# Patient Record
Sex: Male | Born: 1960 | Race: Black or African American | Hispanic: No | Marital: Married | Smoking: Former smoker
Health system: Southern US, Community
[De-identification: ages and names within clinical notes are randomized; demographics above are authoritative.]

## PROBLEM LIST (undated history)

## (undated) DIAGNOSIS — I10 Essential (primary) hypertension: Secondary | ICD-10-CM

## (undated) HISTORY — DX: Essential (primary) hypertension: I10

## (undated) HISTORY — PX: OTHER SURGICAL HISTORY: SHX169

---

## 2011-03-17 ENCOUNTER — Ambulatory Visit (INDEPENDENT_AMBULATORY_CARE_PROVIDER_SITE_OTHER): Payer: BC Managed Care – PPO | Admitting: Family Medicine

## 2011-03-17 VITALS — BP 123/86 | HR 74 | Temp 98.5°F | Resp 16 | Ht 69.25 in | Wt 241.2 lb

## 2011-03-17 DIAGNOSIS — J019 Acute sinusitis, unspecified: Secondary | ICD-10-CM

## 2011-03-17 MED ORDER — AZITHROMYCIN 250 MG PO TABS: ORAL_TABLET | ORAL | Status: AC

## 2011-03-17 MED ORDER — HYDROCODONE-HOMATROPINE 5-1.5 MG/5ML PO SYRP: 5.0000 mL | ORAL_SOLUTION | Freq: Four times a day (QID) | ORAL | Status: AC | PRN

## 2011-03-17 NOTE — Progress Notes (Signed)
  Subjective:    Patient ID: Tim Dennis, male    DOB: August 04, 1960, 51 y.o.   MRN: [id number]  HPI 51 yo male with URI symptoms for a week.  Sore throat, nasal congestion, PND, cough, trouble sleeping due to drainage and cough, sneezing, fatigue.  No fever.  Cough productive clear phlegm.  Trying mucinex and zyrtec - seemed to be helping and then worse last night.  Non-smoker.  No asthma.  Nosebleed today.     Review of Systems Negative except as per HPI     Objective:   Physical Exam  Constitutional: He appears well-developed. No distress.  HENT:  Right Ear: Tympanic membrane, external ear and ear canal normal. Tympanic membrane is not injected, not scarred, not perforated, not erythematous, not retracted and not bulging.  Left Ear: Tympanic membrane, external ear and ear canal normal. Tympanic membrane is not injected, not scarred, not perforated, not erythematous, not retracted and not bulging.  Nose: No mucosal edema or rhinorrhea. Right sinus exhibits no maxillary sinus tenderness and no frontal sinus tenderness. Left sinus exhibits no maxillary sinus tenderness and no frontal sinus tenderness.  Mouth/Throat: Uvula is midline, oropharynx is clear and moist and mucous membranes are normal. No oropharyngeal exudate or tonsillar abscesses.  Cardiovascular: Normal rate, regular rhythm, normal heart sounds and intact distal pulses.   No murmur heard. Pulmonary/Chest: Effort normal and breath sounds normal. No respiratory distress. He has no wheezes. He has no rales.  Lymphadenopathy:       Head (right side): No submandibular and no preauricular adenopathy present.       Head (left side): No submandibular and no preauricular adenopathy present.       Right cervical: No superficial cervical and no posterior cervical adenopathy present.      Left cervical: No superficial cervical and no posterior cervical adenopathy present.       Right: No supraclavicular adenopathy present.   Left: No supraclavicular adenopathy present.  Skin: Skin is warm and dry.          Assessment & Plan:  URI/early sinusitis - zpak, hycodan

## 2011-03-17 NOTE — Patient Instructions (Signed)
Thank you for coming in today.  I appreciate your patience as we become more comfortable with our computer system.  Today you saw Ardeen Garland, MD. I hope you feel better quickly. If you were not given printed prescriptions today, your medications have been sent to your specified pharmacy and can be picked up there.  Please review the information below regarding your diagnosis(es) at your leisure.     Sinusitis Sinuses are air pockets within the bones of your face. The growth of bacteria within a sinus leads to infection. The infection prevents the sinuses from draining. This infection is called sinusitis. SYMPTOMS  There will be different areas of pain depending on which sinuses have become infected.  The maxillary sinuses often produce pain beneath the eyes.   Frontal sinusitis may cause pain in the middle of the forehead and above the eyes.  Other problems (symptoms) include:  Toothaches.   Colored, pus-like (purulent) drainage from the nose.   Swelling, warmth, and tenderness over the sinus areas may be signs of infection.  TREATMENT  Sinusitis is most often determined by an exam.X-rays may be taken. If x-rays have been taken, make sure you obtain your results or find out how you are to obtain them. Your caregiver may give you medications (antibiotics). These are medications that will help kill the bacteria causing the infection. You may also be given a medication (decongestant) that helps to reduce sinus swelling.  HOME CARE INSTRUCTIONS   Only take over-the-counter or prescription medicines for pain, discomfort, or fever as directed by your caregiver.   Drink extra fluids. Fluids help thin the mucus so your sinuses can drain more easily.   Applying either moist heat or ice packs to the sinus areas may help relieve discomfort.   Use saline nasal sprays to help moisten your sinuses. The sprays can be found at your local drugstore.  SEEK IMMEDIATE MEDICAL CARE IF:  You have a  fever.   You have increasing pain, severe headaches, or toothache.   You have nausea, vomiting, or drowsiness.   You develop unusual swelling around the face or trouble seeing.  MAKE SURE YOU:   Understand these instructions.   Will watch your condition.   Will get help right away if you are not doing well or get worse.  Document Released: 01/13/2005 Document Revised: 09/25/2010 Document Reviewed: 08/12/2006 [facility] Patient Information 2012 [place], [place].

## 2011-03-27 ENCOUNTER — Other Ambulatory Visit: Payer: Self-pay | Admitting: Physician Assistant

## 2011-03-27 MED ORDER — LISINOPRIL 10 MG PO TABS: 10.0000 mg | ORAL_TABLET | Freq: Every day | ORAL | Status: DC

## 2011-06-02 ENCOUNTER — Ambulatory Visit (INDEPENDENT_AMBULATORY_CARE_PROVIDER_SITE_OTHER): Payer: BC Managed Care – PPO | Admitting: Internal Medicine

## 2011-06-02 ENCOUNTER — Encounter: Payer: Self-pay | Admitting: Internal Medicine

## 2011-06-02 DIAGNOSIS — M109 Gout, unspecified: Secondary | ICD-10-CM

## 2011-06-02 DIAGNOSIS — Z79899 Other long term (current) drug therapy: Secondary | ICD-10-CM

## 2011-06-02 DIAGNOSIS — I10 Essential (primary) hypertension: Secondary | ICD-10-CM | POA: Insufficient documentation

## 2011-06-02 LAB — BASIC METABOLIC PANEL
BUN: 15 mg/dL (ref 6–23)
CO2: 25 mEq/L (ref 19–32)
Calcium: 9.3 mg/dL (ref 8.4–10.5)
Chloride: 104 mEq/L (ref 96–112)
Creat: 1.29 mg/dL (ref 0.50–1.35)
Glucose, Bld: 85 mg/dL (ref 70–99)
Potassium: 4 mEq/L (ref 3.5–5.3)
Sodium: 138 mEq/L (ref 135–145)

## 2011-06-02 MED ORDER — LISINOPRIL 10 MG PO TABS: 10.0000 mg | ORAL_TABLET | Freq: Every day | ORAL | Status: DC

## 2011-06-02 NOTE — Progress Notes (Signed)
  Subjective:    Patient ID: Tim Dennis, male    DOB: 1960-05-04, 51 y.o.   MRN: [id number]  HPI Doing well Needs to lose 15lbs  Review of Systems     Objective:   Physical Exam Normal all systems     Assessment & Plan:  Calorie counting info HTN controlled

## 2011-06-02 NOTE — Patient Instructions (Signed)
Calorie Counting Diet  A calorie counting diet requires you to eat the number of calories that are right for you in a day. Calories are the measurement of how much energy you get from the food you eat. Eating the right amount of calories is important for staying at a healthy weight. If you eat too many calories, your body will store them as fat and you may gain weight. If you eat too few calories, you may lose weight. Counting the number of calories you eat during a day will help you know if you are eating the right amount. A Registered Dietitian can determine how many calories you need in a day. The amount of calories needed varies from person to person.  If your goal is to lose weight, you will need to eat fewer calories. Losing weight can benefit you if you are overweight or have health problems such as heart disease, high blood pressure, or diabetes. If your goal is to gain weight, you will need to eat more calories. Gaining weight may be necessary if you have a certain health problem that causes your body to need more energy.  TIPS  Whether you are increasing or decreasing the number of calories you eat during a day, it may be hard to get used to changes in what you eat and drink. The following are tips to help you keep track of the number of calories you eat.   Measure foods at home with measuring cups. This helps you know the amount of food and number of calories you are eating.    Restaurants often serve food in amounts that are larger than 1 serving. While eating out, estimate how many servings of a food you are given. For example, a serving of cooked rice is  cup or about the size of half of a fist. Knowing serving sizes will help you be aware of how much food you are eating at restaurants.    Ask for smaller portion sizes or child-size portions at restaurants.    Plan to eat half of a meal at a restaurant. Take the rest home or share the other half with a friend.    Read the Nutrition Facts panel on  food labels for calorie content and serving size. You can find out how many servings are in a package, the size of a serving, and the number of calories each serving has.    For example, a package might contain 3 cookies. The Nutrition Facts panel on that package says that 1 serving is 1 cookie. Below that, it will say there are 3 servings in the container. The calories section of the Nutrition Facts label says there are 90 calories. This means there are 90 calories in 1 cookie (1 serving). If you eat 1 cookie you have eaten 90 calories. If you eat all 3 cookies, you have eaten 270 calories (3 servings x 90 calories = 270 calories).   The list below tells you how big or small some common portion sizes are.   1 oz.........4 stacked dice.    3 oz.........Deck of cards.    1 tsp........Tip of little finger.    1 tbs........Thumb.    2 tbs........Golf ball.     cup.......Half of a fist.    1 cup........A fist.   KEEP A FOOD LOG  Write down every food item you eat, the amount you eat, and the number of calories in each food you eat during the day. At the end of   the day, you can add up the total number of calories you have eaten. It may help to keep a list like the one below. Find out the calorie information by reading the Nutrition Facts panel on food labels.  Breakfast   Bran cereal (1 cup, 110 calories).    Fat-free milk ( cup, 45 calories).   Snack   Apple (1 medium, 80 calories).   Lunch   Spinach (1 cup, 20 calories).    Tomato ( medium, 20 calories).    Chicken breast strips (3 oz, 165 calories).    Shredded cheddar cheese ( cup, 110 calories).    Light Italian dressing (2 tbs, 60 calories).    Whole-wheat bread (1 slice, 80 calories).    Tub margarine (1 tsp, 35 calories).    Vegetable soup (1 cup, 160 calories).   Dinner   Pork chop (3 oz, 190 calories).    Brown rice (1 cup, 215 calories).    Steamed broccoli ( cup, 20 calories).    Strawberries (1  cup, 65 calories).    Whipped  cream (1 tbs, 50 calories).   Daily Calorie Total: 1425  Document Released: 01/13/2005 Document Revised: 01/02/2011 Document Reviewed: 07/10/2006  [employer] Patient Information 2012 [employer].

## 2011-08-18 ENCOUNTER — Ambulatory Visit (INDEPENDENT_AMBULATORY_CARE_PROVIDER_SITE_OTHER): Payer: BC Managed Care – PPO | Admitting: Physician Assistant

## 2011-08-18 VITALS — BP 130/100 | HR 66 | Temp 97.7°F | Resp 16 | Ht 69.25 in | Wt 235.6 lb

## 2011-08-18 DIAGNOSIS — J40 Bronchitis, not specified as acute or chronic: Secondary | ICD-10-CM

## 2011-08-18 DIAGNOSIS — R059 Cough, unspecified: Secondary | ICD-10-CM

## 2011-08-18 DIAGNOSIS — H1089 Other conjunctivitis: Secondary | ICD-10-CM

## 2011-08-18 DIAGNOSIS — R05 Cough: Secondary | ICD-10-CM

## 2011-08-18 DIAGNOSIS — H109 Unspecified conjunctivitis: Secondary | ICD-10-CM

## 2011-08-18 MED ORDER — AZITHROMYCIN 250 MG PO TABS: ORAL_TABLET | ORAL | Status: AC

## 2011-08-18 MED ORDER — POLYMYXIN B-TRIMETHOPRIM 10000-0.1 UNIT/ML-% OP SOLN: 1.0000 [drp] | OPHTHALMIC | Status: AC

## 2011-08-18 NOTE — Patient Instructions (Addendum)
Continue plain Mucinex daily Take Zyrtec as needed for postnasal drainage Recommend Hycodan cough syrup as needed for cough

## 2011-08-18 NOTE — Progress Notes (Signed)
  Subjective:    Patient ID: Tim Dennis, male    DOB: October 06, 1960, 51 y.o.   MRN: [id number]  HPI 51 year old male presents with 1 week history of nasal congestion, postnasal drainage, and cough.  Had sore throat at onset but that has improved.  Denies fever, chills, otalgia, nausea, or vomiting.  He has been taking Mucinex-D and plain Zyrtec. He had some Hycodan from his last visit that he took which has helped with his cough.  He does not smoke. No history of asthma or seasonal allergies.    In addition, he woke up this morning with purulent drainage from his eyes.  Both are red and draining. Admits to pruritis and foreign body sensation. No vision changes or pain.     Review of Systems  All other systems reviewed and are negative.       Objective:   Physical Exam  Constitutional: He is oriented to person, place, and time. He appears well-developed and well-nourished.  HENT:  Head: Normocephalic and atraumatic.  Right Ear: External ear normal.  Left Ear: External ear normal.  Eyes: EOM are normal. Pupils are equal, round, and reactive to light. Right eye exhibits discharge. Left eye exhibits discharge. Right conjunctiva is injected. Left conjunctiva is injected.  Neck: Normal range of motion.  Cardiovascular: Normal rate, regular rhythm and normal heart sounds.   Pulmonary/Chest: Effort normal and breath sounds normal.  Lymphadenopathy:    He has no cervical adenopathy.  Neurological: He is alert and oriented to person, place, and time.  Psychiatric: He has a normal mood and affect. His behavior is normal. Judgment and thought content normal.          Assessment & Plan:   1. Bacterial conjunctivitis  Apply 1 drop to both eyes q4hours.   Good handdwashing Recommend warm compress to help with drainage trimethoprim-polymyxin b (POLYTRIM) ophthalmic solution  2. Bronchitis  Will treat with Z-pack.  D/c Mucinex-D, this is likely why blood pressure is elevated today.  Take plain Mucinex  azithromycin (ZITHROMAX) 250 MG tablet  3. Cough  Hycodan as needed for cough

## 2011-10-09 ENCOUNTER — Ambulatory Visit (INDEPENDENT_AMBULATORY_CARE_PROVIDER_SITE_OTHER): Payer: BC Managed Care – PPO | Admitting: Internal Medicine

## 2011-10-09 VITALS — BP 131/84 | HR 76 | Temp 97.7°F | Resp 16 | Ht 69.5 in | Wt 236.0 lb

## 2011-10-09 DIAGNOSIS — J019 Acute sinusitis, unspecified: Secondary | ICD-10-CM

## 2011-10-09 DIAGNOSIS — R05 Cough: Secondary | ICD-10-CM

## 2011-10-09 DIAGNOSIS — J329 Chronic sinusitis, unspecified: Secondary | ICD-10-CM

## 2011-10-09 DIAGNOSIS — R040 Epistaxis: Secondary | ICD-10-CM

## 2011-10-09 LAB — POCT CBC
MCH, POC: 28.1 pg (ref 27–31.2)
MID (cbc): 0.4 (ref 0–0.9)
MPV: 12.3 fL (ref 0–99.8)
POC Granulocyte: 1.3 — AB (ref 2–6.9)
POC MID %: 9.7 %M (ref 0–12)
Platelet Count, POC: 222 10*3/uL (ref 142–424)
RBC: 5.05 M/uL (ref 4.69–6.13)
WBC: 3.8 10*3/uL — AB (ref 4.6–10.2)

## 2011-10-09 MED ORDER — HYDROCODONE-ACETAMINOPHEN 7.5-500 MG/15ML PO SOLN: 5.0000 mL | Freq: Four times a day (QID) | ORAL | Status: AC | PRN

## 2011-10-09 MED ORDER — AZITHROMYCIN 500 MG PO TABS: 500.0000 mg | ORAL_TABLET | Freq: Every day | ORAL | Status: AC

## 2011-10-09 NOTE — Progress Notes (Signed)
  Subjective:    Patient ID: NICKOLI TRUPP, male    DOB: 10-18-1960, 51 y.o.   MRN: [id number]  HPI Cough, green nasal discharge, bleeding with sneezing. No cp,sob.   Review of Systems     Objective:   Physical Exam  Constitutional: He is oriented to person, place, and time. He appears well-developed and well-nourished.  HENT:  Right Ear: External ear normal.  Left Ear: External ear normal.  Nose: Mucosal edema, rhinorrhea and sinus tenderness present. Epistaxis is observed. Right sinus exhibits maxillary sinus tenderness. Left sinus exhibits maxillary sinus tenderness.  Mouth/Throat: Oropharyngeal exudate present.  Cardiovascular: Normal rate and normal heart sounds.   Pulmonary/Chest: Effort normal and breath sounds normal.  Neurological: He is alert and oriented to person, place, and time. Coordination normal.  Skin: Skin is warm and dry.  Psychiatric: He has a normal mood and affect.          Assessment & Plan:  Sinusitis Cough Epistaxis Hold asa

## 2011-10-09 NOTE — Patient Instructions (Signed)
Sinusitis Sinuses are air pockets within the bones of your face. The growth of bacteria within a sinus leads to infection. The infection prevents the sinuses from draining. This infection is called sinusitis. SYMPTOMS  There will be different areas of pain depending on which sinuses have become infected.  The maxillary sinuses often produce pain beneath the eyes.   Frontal sinusitis may cause pain in the middle of the forehead and above the eyes.  Other problems (symptoms) include:  Toothaches.   Colored, pus-like (purulent) drainage from the nose.   Swelling, warmth, and tenderness over the sinus areas may be signs of infection.  TREATMENT  Sinusitis is most often determined by an exam.X-rays may be taken. If x-rays have been taken, make sure you obtain your results or find out how you are to obtain them. Your caregiver may give you medications (antibiotics). These are medications that will help kill the bacteria causing the infection. You may also be given a medication (decongestant) that helps to reduce sinus swelling.  HOME CARE INSTRUCTIONS   Only take over-the-counter or prescription medicines for pain, discomfort, or fever as directed by your caregiver.   Drink extra fluids. Fluids help thin the mucus so your sinuses can drain more easily.   Applying either moist heat or ice packs to the sinus areas may help relieve discomfort.   Use saline nasal sprays to help moisten your sinuses. The sprays can be found at your local drugstore.  SEEK IMMEDIATE MEDICAL CARE IF:  You have a fever.   You have increasing pain, severe headaches, or toothache.   You have nausea, vomiting, or drowsiness.   You develop unusual swelling around the face or trouble seeing.  MAKE SURE YOU:   Understand these instructions.   Will watch your condition.   Will get help right away if you are not doing well or get worse.  Document Released: 01/13/2005 Document Revised: 01/02/2011 Document Reviewed:  08/12/2006 [facility] Patient Information 2012 [place], [place].Nosebleed Nosebleeds can be caused by many conditions including trauma, infections, polyps, foreign bodies, dry mucous membranes or climate, medications and air conditioning. Most nosebleeds occur in the front of the nose. It is because of this location that most nosebleeds can be controlled by pinching the nostrils gently and continuously. Do this for at least 10 to 20 minutes. The reason for this long continuous pressure is that you must hold it long enough for the blood to clot. If during that 10 to 20 minute time period, pressure is released, the process may have to be started again. The nosebleed may stop by itself, quit with pressure, need concentrated heating (cautery) or stop with pressure from packing. HOME CARE INSTRUCTIONS   If your nose was packed, try to maintain the pack inside until your caregiver removes it. If a gauze pack was used and it starts to fall out, gently replace or cut the end off. Do not cut if a balloon catheter was used to pack the nose. Otherwise, do not remove unless instructed.   Avoid blowing your nose for 12 hours after treatment. This could dislodge the pack or clot and start bleeding again.   If the bleeding starts again, sit up and bending forward, gently pinch the front half of your nose continuously for 20 minutes.   If bleeding was caused by dry mucous membranes, cover the inside of your nose every morning with a petroleum or antibiotic ointment. Use your little fingertip as an applicator. Do this as needed during dry weather.  This will keep the mucous membranes moist and allow them to heal.   Maintain humidity in your home by using less air conditioning or using a humidifier.   Do not use aspirin or medications which make bleeding more likely. Your caregiver can give you recommendations on this.   Resume normal activities as able but try to avoid straining, lifting or bending at the waist for  several days.   If the nosebleeds become recurrent and the cause is unknown, your caregiver may suggest laboratory tests.  SEEK IMMEDIATE MEDICAL CARE IF:   Bleeding recurs and cannot be controlled.   There is unusual bleeding from or bruising on other parts of the body.   You have a fever.   Nosebleeds continue.   There is any worsening of the condition which originally brought you in.   You become lightheaded, feel faint, become sweaty or vomit blood.  MAKE SURE YOU:   Understand these instructions.   Will watch your condition.   Will get help right away if you are not doing well or get worse.  Document Released: 10/23/2004 Document Revised: 01/02/2011 Document Reviewed: 12/15/2008 [facility] Patient Information 2012 [place], [place].

## 2011-12-24 ENCOUNTER — Other Ambulatory Visit: Payer: Self-pay | Admitting: Internal Medicine

## 2012-01-05 ENCOUNTER — Ambulatory Visit (INDEPENDENT_AMBULATORY_CARE_PROVIDER_SITE_OTHER): Payer: BC Managed Care – PPO | Admitting: Internal Medicine

## 2012-01-05 ENCOUNTER — Encounter: Payer: Self-pay | Admitting: Internal Medicine

## 2012-01-05 VITALS — BP 130/98 | HR 66 | Temp 97.8°F | Resp 16 | Ht 69.75 in | Wt 238.8 lb

## 2012-01-05 DIAGNOSIS — Z0000 Encounter for general adult medical examination without abnormal findings: Secondary | ICD-10-CM

## 2012-01-05 DIAGNOSIS — I10 Essential (primary) hypertension: Secondary | ICD-10-CM

## 2012-01-05 DIAGNOSIS — Z23 Encounter for immunization: Secondary | ICD-10-CM

## 2012-01-05 LAB — IFOBT (OCCULT BLOOD): IFOBT: NEGATIVE

## 2012-01-05 LAB — COMPREHENSIVE METABOLIC PANEL
ALT: 25 U/L (ref 0–53)
AST: 26 U/L (ref 0–37)
Albumin: 4.2 g/dL (ref 3.5–5.2)
BUN: 17 mg/dL (ref 6–23)
CO2: 26 mEq/L (ref 19–32)
Calcium: 9 mg/dL (ref 8.4–10.5)
Chloride: 105 mEq/L (ref 96–112)
Potassium: 4.1 mEq/L (ref 3.5–5.3)

## 2012-01-05 LAB — CBC WITH DIFFERENTIAL/PLATELET
Basophils Absolute: 0 10*3/uL (ref 0.0–0.1)
Basophils Relative: 0 % (ref 0–1)
Hemoglobin: 15 g/dL (ref 13.0–17.0)
Lymphocytes Relative: 36 % (ref 12–46)
MCHC: 35 g/dL (ref 30.0–36.0)
Monocytes Relative: 6 % (ref 3–12)
Neutro Abs: 3.1 10*3/uL (ref 1.7–7.7)
Neutrophils Relative %: 56 % (ref 43–77)
RDW: 14.5 % (ref 11.5–15.5)
WBC: 5.7 10*3/uL (ref 4.0–10.5)

## 2012-01-05 LAB — POCT URINALYSIS DIPSTICK
Glucose, UA: NEGATIVE
Ketones, UA: NEGATIVE
Spec Grav, UA: 1.02

## 2012-01-05 LAB — POCT UA - MICROSCOPIC ONLY
Bacteria, U Microscopic: NEGATIVE
Mucus, UA: NEGATIVE

## 2012-01-05 LAB — LIPID PANEL
Cholesterol: 136 mg/dL (ref 0–200)
LDL Cholesterol: 72 mg/dL (ref 0–99)
VLDL: 12 mg/dL (ref 0–40)

## 2012-01-05 LAB — PSA: PSA: 1.32 ng/mL (ref <=4.00)

## 2012-01-05 NOTE — Patient Instructions (Signed)

## 2012-01-05 NOTE — Progress Notes (Signed)
  Subjective:    Patient ID: Tim Dennis, male    DOB: [birth year], 51 y.o.   MRN: [id number]  HPI HTN controlled Overweight Tolerates meds Exercising   Review of Systems  Constitutional: Negative.   HENT: Negative.   Eyes: Negative.   Respiratory: Negative.   Cardiovascular: Negative.   Gastrointestinal: Negative.   Genitourinary: Negative.   Musculoskeletal: Negative.   Neurological: Negative.   Hematological: Negative.   Psychiatric/Behavioral: Negative.        Objective:   Physical Exam  Constitutional: He is oriented to person, place, and time. He appears well-developed and well-nourished.  HENT:  Right Ear: External ear normal.  Left Ear: External ear normal.  Nose: Nose normal.  Mouth/Throat: Oropharynx is clear and moist.  Eyes: EOM are normal. Pupils are equal, round, and reactive to light.  Neck: Neck supple. No tracheal deviation present. No thyromegaly present.  Cardiovascular: Normal rate, regular rhythm, normal heart sounds and intact distal pulses.   Pulmonary/Chest: Effort normal and breath sounds normal.  Abdominal: He exhibits no mass. There is no tenderness.  Genitourinary: Rectum normal, prostate normal and penis normal.  Musculoskeletal: Normal range of motion.  Lymphadenopathy:    He has no cervical adenopathy.  Neurological: He is alert and oriented to person, place, and time. He has normal reflexes. No cranial nerve deficit. He exhibits normal muscle tone. Coordination normal.  Skin: Skin is warm and dry.  Psychiatric: He has a normal mood and affect. Thought content normal.   Results for orders placed in visit on 01/05/12  POCT UA - MICROSCOPIC ONLY      Component Value Range   WBC, Ur, HPF, POC neg     RBC, urine, microscopic 0-3     Bacteria, U Microscopic neg     Mucus, UA neg     Epithelial cells, urine per micros 0-1     Crystals, Ur, HPF, POC neg     Casts, Ur, LPF, POC neg     Yeast, UA neg    POCT URINALYSIS DIPSTICK    Component Value Range   Color, UA yellow     Clarity, UA clear     Glucose, UA neg     Bilirubin, UA neg     Ketones, UA neg     Spec Grav, UA 1.020     Blood, UA neg     pH, UA 5.0     Protein, UA neg     Urobilinogen, UA 0.2     Nitrite, UA neg     Leukocytes, UA Negative    IFOBT (OCCULT BLOOD)      Component Value Range   IFOBT Negative            Assessment & Plan:  Continue home bp monitoring RF meds 1 yr

## 2012-01-06 ENCOUNTER — Encounter: Payer: Self-pay | Admitting: *Deleted

## 2012-01-12 ENCOUNTER — Telehealth: Payer: Self-pay

## 2012-01-12 NOTE — Telephone Encounter (Signed)
Patient states that his bp was up during physical last week and yesterday it was 130/89. Would like to know if Dr. Perrin Maltese wants him to take Lisinopril. Please call at [phone]

## 2012-01-12 NOTE — Telephone Encounter (Signed)
Spoke with patient states he takes Lisinopril 10 mg daily for six days and Lisinopril 10 mg/ HCTZ once a week.  Should he increase he's medications?

## 2012-02-17 ENCOUNTER — Other Ambulatory Visit: Payer: Self-pay | Admitting: Internal Medicine

## 2012-02-17 ENCOUNTER — Ambulatory Visit (INDEPENDENT_AMBULATORY_CARE_PROVIDER_SITE_OTHER): Payer: BC Managed Care – PPO | Admitting: Family Medicine

## 2012-02-17 ENCOUNTER — Ambulatory Visit: Payer: BC Managed Care – PPO

## 2012-02-17 VITALS — BP 128/92 | HR 68 | Temp 97.7°F | Resp 20 | Ht 69.25 in | Wt 242.0 lb

## 2012-02-17 DIAGNOSIS — M25519 Pain in unspecified shoulder: Secondary | ICD-10-CM

## 2012-02-17 DIAGNOSIS — R0789 Other chest pain: Secondary | ICD-10-CM

## 2012-02-17 DIAGNOSIS — R071 Chest pain on breathing: Secondary | ICD-10-CM

## 2012-02-17 MED ORDER — NABUMETONE 750 MG PO TABS: 750.0000 mg | ORAL_TABLET | Freq: Two times a day (BID) | ORAL | Status: DC

## 2012-02-17 NOTE — Progress Notes (Signed)
Subjective: 52 year old man who presents with complaint of right shoulder pain for the last 8 days or so. He did have a dear hangup when he was pulling out of the [place] initially, and that may have started solve the problem. He had bursitis many years ago. He has over the last few days had a progression of the pain to where its hurting across anterior chest along the collarbone area and into the left shoulder some. Today he spent the day at the desk, did not do much physical work. Sometimes has to move a desk or something like that, but does not do regular physical labor.  Objective: Pleasant man in no major distress at this time. However when the pain flares bad he says he would give it an 8/10 at times. He has some tenderness in the right shoulder under the a.c. joint and posterior to that. He has fair range of motion of the shoulder. Right now it seems to have calmed down a little but is not exquisitely tender. His left shoulder seems nontender. No tenderness across chest wall. Chest is clear to auscultation. He takes a deep breath however he says he feels across his upper chest. Heart regular without murmurs.  Assessment: Bursitis and shoulder strain and pain right shoulder. Nonspecific chest wall pain  Plan: We'll get an x-ray of his right shoulder and proceed from there  [facility] reading (PRIMARY) by  Dr. Alwyn Ren Normal shoulder .  Treat patient report Relafen 750 twice a day. If not doing better he had a cortisone shot. He elected not to have the shot tonight.

## 2012-02-17 NOTE — Patient Instructions (Addendum)
Take the medication one twice daily with food. If you continue to have a lot of pain in the shoulder come back and we can give an injection of cortisone.

## 2012-04-27 ENCOUNTER — Other Ambulatory Visit: Payer: Self-pay | Admitting: Physician Assistant

## 2012-06-09 ENCOUNTER — Other Ambulatory Visit: Payer: Self-pay | Admitting: Internal Medicine

## 2012-07-12 ENCOUNTER — Ambulatory Visit: Payer: BC Managed Care – PPO | Admitting: Internal Medicine

## 2012-07-19 ENCOUNTER — Ambulatory Visit (INDEPENDENT_AMBULATORY_CARE_PROVIDER_SITE_OTHER): Payer: BC Managed Care – PPO | Admitting: Internal Medicine

## 2012-07-19 ENCOUNTER — Encounter: Payer: Self-pay | Admitting: Internal Medicine

## 2012-07-19 VITALS — BP 128/93 | HR 74 | Temp 97.4°F | Resp 16 | Ht 69.0 in | Wt 243.0 lb

## 2012-07-19 DIAGNOSIS — Z794 Long term (current) use of insulin: Secondary | ICD-10-CM

## 2012-07-19 DIAGNOSIS — I10 Essential (primary) hypertension: Secondary | ICD-10-CM

## 2012-07-19 DIAGNOSIS — Z79899 Other long term (current) drug therapy: Secondary | ICD-10-CM

## 2012-07-19 DIAGNOSIS — M109 Gout, unspecified: Secondary | ICD-10-CM

## 2012-07-19 LAB — COMPREHENSIVE METABOLIC PANEL
AST: 22 U/L (ref 0–37)
Albumin: 3.8 g/dL (ref 3.5–5.2)
Alkaline Phosphatase: 75 U/L (ref 39–117)
Calcium: 9.3 mg/dL (ref 8.4–10.5)
Chloride: 106 mEq/L (ref 96–112)
Glucose, Bld: 97 mg/dL (ref 70–99)
Potassium: 4 mEq/L (ref 3.5–5.3)
Sodium: 141 mEq/L (ref 135–145)
Total Protein: 7 g/dL (ref 6.0–8.3)

## 2012-07-19 LAB — CBC WITH DIFFERENTIAL/PLATELET
Basophils Absolute: 0 10*3/uL (ref 0.0–0.1)
Eosinophils Absolute: 0.2 10*3/uL (ref 0.0–0.7)
Eosinophils Relative: 3 % (ref 0–5)
MCH: 29.6 pg (ref 26.0–34.0)
MCV: 85.7 fL (ref 78.0–100.0)
Neutrophils Relative %: 54 % (ref 43–77)
Platelets: 196 10*3/uL (ref 150–400)
RDW: 14.4 % (ref 11.5–15.5)
WBC: 5.8 10*3/uL (ref 4.0–10.5)

## 2012-07-19 NOTE — Patient Instructions (Addendum)
2 Gram Low Sodium Diet A 2 gram sodium diet restricts the amount of sodium in the diet to no more than 2 g or 2000 mg daily. Limiting the amount of sodium is often used to help lower blood pressure. It is important if you have heart, liver, or kidney problems. Many foods contain sodium for flavor and sometimes as a preservative. When the amount of sodium in a diet needs to be low, it is important to know what to look for when choosing foods and drinks. The following includes some information and guidelines to help make it easier for you to adapt to a low sodium diet. QUICK TIPS  Do not add salt to food.  Avoid convenience items and fast food.  Choose unsalted snack foods.  Buy lower sodium products, often labeled as "lower sodium" or "no salt added."  Check food labels to learn how much sodium is in 1 serving.  When eating at a restaurant, ask that your food be prepared with less salt or none, if possible. READING FOOD LABELS FOR SODIUM INFORMATION The nutrition facts label is a good place to find how much sodium is in foods. Look for products with no more than 500 to 600 mg of sodium per meal and no more than 150 mg per serving. Remember that 2 g = 2000 mg. The food label may also list foods as:  Sodium-free: Less than 5 mg in a serving.  Very low sodium: 35 mg or less in a serving.  Low-sodium: 140 mg or less in a serving.  Light in sodium: 50% less sodium in a serving. For example, if a food that usually has 300 mg of sodium is changed to become light in sodium, it will have 150 mg of sodium.  Reduced sodium: 25% less sodium in a serving. For example, if a food that usually has 400 mg of sodium is changed to reduced sodium, it will have 300 mg of sodium. CHOOSING FOODS Grains  Avoid: Salted crackers and snack items. Some cereals, including instant hot cereals. Bread stuffing and biscuit mixes. Seasoned rice or pasta mixes.  Choose: Unsalted snack items. Low-sodium cereals, oats,  puffed wheat and rice, shredded wheat. English muffins and bread. Pasta. Meats  Avoid: Salted, canned, smoked, spiced, pickled meats, including fish and poultry. Bacon, ham, sausage, cold cuts, hot dogs, anchovies.  Choose: Low-sodium canned tuna and salmon. Fresh or frozen meat, poultry, and fish. Dairy  Avoid: Processed cheese and spreads. Cottage cheese. Buttermilk and condensed milk. Regular cheese.  Choose: Milk. Low-sodium cottage cheese. Yogurt. Sour cream. Low-sodium cheese. Fruits and Vegetables  Avoid: Regular canned vegetables. Regular canned tomato sauce and paste. Frozen vegetables in sauces. Olives. Pickles. Relishes. Sauerkraut.  Choose: Low-sodium canned vegetables. Low-sodium tomato sauce and paste. Frozen or fresh vegetables. Fresh and frozen fruit. Condiments  Avoid: Canned and packaged gravies. Worcestershire sauce. Tartar sauce. Barbecue sauce. Soy sauce. Steak sauce. Ketchup. Onion, garlic, and table salt. Meat flavorings and tenderizers.  Choose: Fresh and dried herbs and spices. Low-sodium varieties of mustard and ketchup. Lemon juice. Tabasco sauce. Horseradish. SAMPLE 2 GRAM SODIUM MEAL PLAN Breakfast / Sodium (mg)  1 cup low-fat milk / 143 mg  2 slices whole-wheat toast / 270 mg  1 tbs heart-healthy margarine / 153 mg  1 hard-boiled egg / 139 mg  1 small orange / 0 mg Lunch / Sodium (mg)  1 cup raw carrots / 76 mg   cup hummus / 298 mg  1 cup low-fat milk /   143 mg   cup red grapes / 2 mg  1 whole-wheat pita bread / 356 mg Dinner / Sodium (mg)  1 cup whole-wheat pasta / 2 mg  1 cup low-sodium tomato sauce / 73 mg  3 oz lean ground beef / 57 mg  1 small side salad (1 cup raw spinach leaves,  cup cucumber,  cup yellow bell pepper) with 1 tsp olive oil and 1 tsp red wine vinegar / 25 mg Snack / Sodium (mg)  1 container low-fat vanilla yogurt / 107 mg  3 graham cracker squares / 127 mg Nutrient Analysis  Calories: 2033  Protein:  77 g  Carbohydrate: 282 g  Fat: 72 g  Sodium: 1971 mg Document Released: 01/13/2005 Document Revised: 04/07/2011 Document Reviewed: 04/16/2009 [employer] Patient Information 2014 [employer].  

## 2012-07-19 NOTE — Progress Notes (Signed)
Subjective:    Patient ID: Tim Dennis, male    DOB: 03-19-1960, 52 y.o.   MRN: [id number]  HPI htn contolled meds tolerated no problems   Review of Systems unchanged    Objective:   Physical Exam  Vitals reviewed. Constitutional: He is oriented to person, place, and time. He appears well-developed and well-nourished.  Eyes: EOM are normal. Pupils are equal, round, and reactive to light.  Cardiovascular: Normal rate, regular rhythm and normal heart sounds.   Pulmonary/Chest: Effort normal and breath sounds normal.  Genitourinary: Rectum normal, prostate normal and penis normal.  Musculoskeletal: Normal range of motion.  Neurological: He is alert and oriented to person, place, and time. He exhibits normal muscle tone.  Psychiatric: He has a normal mood and affect.   Results for orders placed in visit on 01/05/12  CBC WITH DIFFERENTIAL      Result Value Range   WBC 5.7  4.0 - 10.5 K/uL   RBC 5.03  4.22 - 5.81 MIL/uL   Hemoglobin 15.0  13.0 - 17.0 g/dL   HCT 40.9  81.1 - 91.4 %   MCV 85.3  78.0 - 100.0 fL   MCH 29.8  26.0 - 34.0 pg   MCHC 35.0  30.0 - 36.0 g/dL   RDW 78.2  95.6 - 21.3 %   Platelets 233  150 - 400 K/uL   Neutrophils Relative % 56  43 - 77 %   Neutro Abs 3.1  1.7 - 7.7 K/uL   Lymphocytes Relative 36  12 - 46 %   Lymphs Abs 2.1  0.7 - 4.0 K/uL   Monocytes Relative 6  3 - 12 %   Monocytes Absolute 0.4  0.1 - 1.0 K/uL   Eosinophils Relative 2  0 - 5 %   Eosinophils Absolute 0.1  0.0 - 0.7 K/uL   Basophils Relative 0  0 - 1 %   Basophils Absolute 0.0  0.0 - 0.1 K/uL   Smear Review Criteria for review not met    COMPREHENSIVE METABOLIC PANEL      Result Value Range   Sodium 140  135 - 145 mEq/L   Potassium 4.1  3.5 - 5.3 mEq/L   Chloride 105  96 - 112 mEq/L   CO2 26  19 - 32 mEq/L   Glucose, Bld 81  70 - 99 mg/dL   BUN 17  6 - 23 mg/dL   Creat 0.86  5.78 - 4.69 mg/dL   Total Bilirubin 0.5  0.3 - 1.2 mg/dL   Alkaline Phosphatase 70  39 - 117 U/L    AST 26  0 - 37 U/L   ALT 25  0 - 53 U/L   Total Protein 6.8  6.0 - 8.3 g/dL   Albumin 4.2  3.5 - 5.2 g/dL   Calcium 9.0  8.4 - 62.9 mg/dL  PSA      Result Value Range   PSA 1.32  <=4.00 ng/mL  TSH      Result Value Range   TSH 2.343  0.350 - 4.500 uIU/mL  LIPID PANEL      Result Value Range   Cholesterol 136  0 - 200 mg/dL   Triglycerides 62  <528 mg/dL   HDL 52  >41 mg/dL   Total CHOL/HDL Ratio 2.6     VLDL 12  0 - 40 mg/dL   LDL Cholesterol 72  0 - 99 mg/dL  POCT UA - MICROSCOPIC ONLY      Result  Value Range   WBC, Ur, HPF, POC neg     RBC, urine, microscopic 0-3     Bacteria, U Microscopic neg     Mucus, UA neg     Epithelial cells, urine per micros 0-1     Crystals, Ur, HPF, POC neg     Casts, Ur, LPF, POC neg     Yeast, UA neg    POCT URINALYSIS DIPSTICK      Result Value Range   Color, UA yellow     Clarity, UA clear     Glucose, UA neg     Bilirubin, UA neg     Ketones, UA neg     Spec Grav, UA 1.020     Blood, UA neg     pH, UA 5.0     Protein, UA neg     Urobilinogen, UA 0.2     Nitrite, UA neg     Leukocytes, UA Negative    IFOBT (OCCULT BLOOD)      Result Value Range   IFOBT Negative             Assessment & Plan:  RF meds 1 yr

## 2012-12-25 ENCOUNTER — Other Ambulatory Visit: Payer: Self-pay | Admitting: Physician Assistant

## 2012-12-25 ENCOUNTER — Other Ambulatory Visit: Payer: Self-pay | Admitting: Internal Medicine

## 2013-01-10 ENCOUNTER — Encounter: Payer: BC Managed Care – PPO | Admitting: Internal Medicine

## 2013-01-17 ENCOUNTER — Encounter: Payer: BC Managed Care – PPO | Admitting: Internal Medicine

## 2013-01-24 ENCOUNTER — Encounter: Payer: Self-pay | Admitting: Internal Medicine

## 2013-01-24 ENCOUNTER — Ambulatory Visit (INDEPENDENT_AMBULATORY_CARE_PROVIDER_SITE_OTHER): Payer: BC Managed Care – PPO | Admitting: Internal Medicine

## 2013-01-24 VITALS — BP 126/84 | HR 82 | Temp 98.2°F | Resp 16 | Ht 70.0 in | Wt 247.1 lb

## 2013-01-24 DIAGNOSIS — M109 Gout, unspecified: Secondary | ICD-10-CM

## 2013-01-24 DIAGNOSIS — Z0000 Encounter for general adult medical examination without abnormal findings: Secondary | ICD-10-CM

## 2013-01-24 DIAGNOSIS — Z23 Encounter for immunization: Secondary | ICD-10-CM

## 2013-01-24 DIAGNOSIS — Z79899 Other long term (current) drug therapy: Secondary | ICD-10-CM

## 2013-01-24 DIAGNOSIS — I10 Essential (primary) hypertension: Secondary | ICD-10-CM

## 2013-01-24 LAB — POCT URINALYSIS DIPSTICK
Glucose, UA: NEGATIVE
Leukocytes, UA: NEGATIVE
Nitrite, UA: NEGATIVE
Urobilinogen, UA: 0.2

## 2013-01-24 LAB — CBC WITH DIFFERENTIAL/PLATELET
Basophils Relative: 0 % (ref 0–1)
Eosinophils Absolute: 0.2 10*3/uL (ref 0.0–0.7)
Eosinophils Relative: 2 % (ref 0–5)
HCT: 42.4 % (ref 39.0–52.0)
Hemoglobin: 15.3 g/dL (ref 13.0–17.0)
Lymphocytes Relative: 40 % (ref 12–46)
MCH: 30 pg (ref 26.0–34.0)
MCHC: 36.1 g/dL — ABNORMAL HIGH (ref 30.0–36.0)
Monocytes Absolute: 0.5 10*3/uL (ref 0.1–1.0)
Monocytes Relative: 7 % (ref 3–12)
RDW: 14.1 % (ref 11.5–15.5)
WBC: 7 10*3/uL (ref 4.0–10.5)

## 2013-01-24 LAB — COMPLETE METABOLIC PANEL WITH GFR
Albumin: 4 g/dL (ref 3.5–5.2)
Alkaline Phosphatase: 78 U/L (ref 39–117)
BUN: 19 mg/dL (ref 6–23)
CO2: 26 mEq/L (ref 19–32)
Calcium: 9.5 mg/dL (ref 8.4–10.5)
Creat: 1.31 mg/dL (ref 0.50–1.35)
GFR, Est Non African American: 61 mL/min
Glucose, Bld: 86 mg/dL (ref 70–99)
Total Bilirubin: 0.4 mg/dL (ref 0.3–1.2)

## 2013-01-24 LAB — POCT UA - MICROSCOPIC ONLY
Casts, Ur, LPF, POC: NEGATIVE
Yeast, UA: NEGATIVE

## 2013-01-24 LAB — PSA: PSA: 1.24 ng/mL (ref <=4.00)

## 2013-01-24 LAB — IFOBT (OCCULT BLOOD): IFOBT: NEGATIVE

## 2013-01-24 LAB — LIPID PANEL
LDL Cholesterol: 99 mg/dL (ref 0–99)
Triglycerides: 106 mg/dL (ref <150)

## 2013-01-24 LAB — TSH: TSH: 3.161 u[IU]/mL (ref 0.350–4.500)

## 2013-01-24 NOTE — Patient Instructions (Signed)
Fat and Cholesterol Control Diet Fat and cholesterol levels in your blood and organs are influenced by your diet. High levels of fat and cholesterol may lead to diseases of the heart, small and large blood vessels, gallbladder, liver, and pancreas. CONTROLLING FAT AND CHOLESTEROL WITH DIET Although exercise and lifestyle factors are important, your diet is key. That is because certain foods are known to raise cholesterol and others to lower it. The goal is to balance foods for their effect on cholesterol and more importantly, to replace saturated and trans fat with other types of fat, such as monounsaturated fat, polyunsaturated fat, and omega-3 fatty acids. On average, a person should consume no more than 15 to 17 g of saturated fat daily. Saturated and trans fats are considered "bad" fats, and they will raise LDL cholesterol. Saturated fats are primarily found in animal products such as meats, butter, and cream. However, that does not mean you need to give up all your favorite foods. Today, there are good tasting, low-fat, low-cholesterol substitutes for most of the things you like to eat. Choose low-fat or nonfat alternatives. Choose round or loin cuts of red meat. These types of cuts are lowest in fat and cholesterol. Chicken (without the skin), fish, veal, and ground turkey breast are great choices. Eliminate fatty meats, such as hot dogs and salami. Even shellfish have little or no saturated fat. Have a 3 oz (85 g) portion when you eat lean meat, poultry, or fish. Trans fats are also called "partially hydrogenated oils." They are oils that have been scientifically manipulated so that they are solid at room temperature resulting in a longer shelf life and improved taste and texture of foods in which they are added. Trans fats are found in stick margarine, some tub margarines, cookies, crackers, and baked goods.  When baking and cooking, oils are a great substitute for butter. The monounsaturated oils are  especially beneficial since it is believed they lower LDL and raise HDL. The oils you should avoid entirely are saturated tropical oils, such as coconut and palm.  Remember to eat a lot from food groups that are naturally free of saturated and trans fat, including fish, fruit, vegetables, beans, grains (barley, rice, couscous, bulgur wheat), and pasta (without cream sauces).  IDENTIFYING FOODS THAT LOWER FAT AND CHOLESTEROL  Soluble fiber may lower your cholesterol. This type of fiber is found in fruits such as apples, vegetables such as broccoli, potatoes, and carrots, legumes such as beans, peas, and lentils, and grains such as barley. Foods fortified with plant sterols (phytosterol) may also lower cholesterol. You should eat at least 2 g per day of these foods for a cholesterol lowering effect.  Read package labels to identify low-saturated fats, trans fat free, and low-fat foods at the supermarket. Select cheeses that have only 2 to 3 g saturated fat per ounce. Use a heart-healthy tub margarine that is free of trans fats or partially hydrogenated oil. When buying baked goods (cookies, crackers), avoid partially hydrogenated oils. Breads and muffins should be made from whole grains (whole-wheat or whole oat flour, instead of "flour" or "enriched flour"). Buy non-creamy canned soups with reduced salt and no added fats.  FOOD PREPARATION TECHNIQUES  Never deep-fry. If you must fry, either stir-fry, which uses very little fat, or use non-stick cooking sprays. When possible, broil, bake, or roast meats, and steam vegetables. Instead of putting butter or margarine on vegetables, use lemon and herbs, applesauce, and cinnamon (for squash and sweet potatoes). Use nonfat   yogurt, salsa, and low-fat dressings for salads.  LOW-SATURATED FAT / LOW-FAT FOOD SUBSTITUTES Meats / Saturated Fat (g)  Avoid: Steak, marbled (3 oz/85 g) / 11 g  Choose: Steak, lean (3 oz/85 g) / 4 g  Avoid: Hamburger (3 oz/85 g) / 7  g  Choose: Hamburger, lean (3 oz/85 g) / 5 g  Avoid: Ham (3 oz/85 g) / 6 g  Choose: Ham, lean cut (3 oz/85 g) / 2.4 g  Avoid: Chicken, with skin, dark meat (3 oz/85 g) / 4 g  Choose: Chicken, skin removed, dark meat (3 oz/85 g) / 2 g  Avoid: Chicken, with skin, light meat (3 oz/85 g) / 2.5 g  Choose: Chicken, skin removed, light meat (3 oz/85 g) / 1 g Dairy / Saturated Fat (g)  Avoid: Whole milk (1 cup) / 5 g  Choose: Low-fat milk, 2% (1 cup) / 3 g  Choose: Low-fat milk, 1% (1 cup) / 1.5 g  Choose: Skim milk (1 cup) / 0.3 g  Avoid: Hard cheese (1 oz/28 g) / 6 g  Choose: Skim milk cheese (1 oz/28 g) / 2 to 3 g  Avoid: Cottage cheese, 4% fat (1 cup) / 6.5 g  Choose: Low-fat cottage cheese, 1% fat (1 cup) / 1.5 g  Avoid: Ice cream (1 cup) / 9 g  Choose: Sherbet (1 cup) / 2.5 g  Choose: Nonfat frozen yogurt (1 cup) / 0.3 g  Choose: Frozen fruit bar / trace  Avoid: Whipped cream (1 tbs) / 3.5 g  Choose: Nondairy whipped topping (1 tbs) / 1 g Condiments / Saturated Fat (g)  Avoid: Mayonnaise (1 tbs) / 2 g  Choose: Low-fat mayonnaise (1 tbs) / 1 g  Avoid: Butter (1 tbs) / 7 g  Choose: Extra light margarine (1 tbs) / 1 g  Avoid: Coconut oil (1 tbs) / 11.8 g  Choose: Olive oil (1 tbs) / 1.8 g  Choose: Corn oil (1 tbs) / 1.7 g  Choose: Safflower oil (1 tbs) / 1.2 g  Choose: Sunflower oil (1 tbs) / 1.4 g  Choose: Soybean oil (1 tbs) / 2.4 g  Choose: Canola oil (1 tbs) / 1 g Document Released: 01/13/2005 Document Revised: 05/10/2012 Document Reviewed: 07/04/2010 [employer] Patient Information 2014 [place], [place]. Calorie Counting Diet A calorie counting diet requires you to eat the number of calories that are right for you in a day. Calories are the measurement of how much energy you get from the food you eat. Eating the right amount of calories is important for staying at a healthy weight. If you eat too many calories, your body will store them as fat and you  may gain weight. If you eat too few calories, you may lose weight. Counting the number of calories you eat during a day will help you know if you are eating the right amount. A Registered Dietitian can determine how many calories you need in a day. The amount of calories needed varies from person to person. If your goal is to lose weight, you will need to eat fewer calories. Losing weight can benefit you if you are overweight or have health problems such as heart disease, high blood pressure, or diabetes. If your goal is to gain weight, you will need to eat more calories. Gaining weight may be necessary if you have a certain health problem that causes your body to need more energy. TIPS Whether you are increasing or decreasing the number of calories you eat during  a day, it may be hard to get used to changes in what you eat and drink. The following are tips to help you keep track of the number of calories you eat.  Measure foods at home with measuring cups. This helps you know the amount of food and number of calories you are eating.  Restaurants often serve food in amounts that are larger than 1 serving. While eating out, estimate how many servings of a food you are given. For example, a serving of cooked rice is  cup or about the size of half of a fist. Knowing serving sizes will help you be aware of how much food you are eating at restaurants.  Ask for smaller portion sizes or child-size portions at restaurants.  Plan to eat half of a meal at a restaurant. Take the rest home or share the other half with a friend.  Read the Nutrition Facts panel on food labels for calorie content and serving size. You can find out how many servings are in a package, the size of a serving, and the number of calories each serving has.  For example, a package might contain 3 cookies. The Nutrition Facts panel on that package says that 1 serving is 1 cookie. Below that, it will say there are 3 servings in the  container. The calories section of the Nutrition Facts label says there are 90 calories. This means there are 90 calories in 1 cookie (1 serving). If you eat 1 cookie you have eaten 90 calories. If you eat all 3 cookies, you have eaten 270 calories (3 servings x 90 calories = 270 calories). The list below tells you how big or small some common portion sizes are.  1 oz.........4 stacked dice.  3 oz........Marland KitchenDeck of cards.  1 tsp.......Marland KitchenTip of little finger.  1 tbs......Marland KitchenMarland KitchenThumb.  2 tbs.......Marland KitchenGolf ball.   cup......Marland KitchenHalf of a fist.  1 cup.......Marland KitchenA fist. KEEP A FOOD LOG Write down every food item you eat, the amount you eat, and the number of calories in each food you eat during the day. At the end of the day, you can add up the total number of calories you have eaten. It may help to keep a list like the one below. Find out the calorie information by reading the Nutrition Facts panel on food labels. Breakfast  Bran cereal (1 cup, 110 calories).  Fat-free milk ( cup, 45 calories). Snack  Apple (1 medium, 80 calories). Lunch  Spinach (1 cup, 20 calories).  Tomato ( medium, 20 calories).  Chicken breast strips (3 oz, 165 calories).  Shredded cheddar cheese ( cup, 110 calories).  Light [place] dressing (2 tbs, 60 calories).  Whole-wheat bread (1 slice, 80 calories).  Tub margarine (1 tsp, 35 calories).  Vegetable soup (1 cup, 160 calories). Dinner  Pork chop (3 oz, 190 calories).  Brown rice (1 cup, 215 calories).  Steamed broccoli ( cup, 20 calories).  Strawberries (1  cup, 65 calories).  Whipped cream (1 tbs, 50 calories). Daily Calorie Total: 1425 Document Released: 01/13/2005 Document Revised: 04/07/2011 Document Reviewed: 07/10/2006 [facility] Patient Information 2014 [place], [place].

## 2013-01-24 NOTE — Progress Notes (Signed)
   Subjective:    Patient ID: Tim Dennis, male    DOB: [birth year], 52 y.o.   MRN: [id number]  HPI Doing well, HTN controlled, readyt o exercise and lose weight. No gout attacks, tolerates all meds.   Review of Systems  Constitutional: Negative.   HENT: Negative.   Eyes: Negative.   Respiratory: Negative.   Cardiovascular: Negative.   Gastrointestinal: Negative.   Endocrine: Negative.   Genitourinary: Negative.   Musculoskeletal: Negative.   Skin: Negative.   Allergic/Immunologic: Negative.   Neurological: Negative.   Hematological: Negative.   Psychiatric/Behavioral: Negative.        Objective:   Physical Exam  Vitals reviewed. Constitutional: He is oriented to person, place, and time. He appears well-developed and well-nourished.  HENT:  Head: Normocephalic.  Right Ear: External ear normal.  Left Ear: External ear normal.  Nose: Nose normal.  Mouth/Throat: Oropharynx is clear and moist.  Eyes: Conjunctivae and EOM are normal. Pupils are equal, round, and reactive to light. No scleral icterus.  Neck: Normal range of motion. Neck supple. No tracheal deviation present. No thyromegaly present.  Cardiovascular: Normal rate, regular rhythm, normal heart sounds and intact distal pulses.   Pulmonary/Chest: Effort normal and breath sounds normal.  Abdominal: Soft. He exhibits no mass. There is no tenderness.  Genitourinary: Rectum normal, prostate normal and penis normal.  Musculoskeletal: Normal range of motion.  Lymphadenopathy:    He has no cervical adenopathy.  Neurological: He is alert and oriented to person, place, and time. He has normal reflexes. No cranial nerve deficit. He exhibits normal muscle tone. Coordination normal.  Skin: Skin is warm. No rash noted.  Psychiatric: He has a normal mood and affect. His behavior is normal.   EKG normal  Healthy cpe     Assessment & Plan:  HTN continue same regimine RF meds 1 year RTC 6 mos routine

## 2013-01-24 NOTE — Progress Notes (Signed)
   Subjective:    Patient ID: Tim Dennis, male    DOB: April 12, 1960, 52 y.o.   MRN: [id number]  HPI 52 yr old [place] male is here today for his annual physical. Pt is fasting today as well. Pt has no complaints or concerns. Pt states he has gained 7-8 pounds.     Review of Systems     Objective:   Physical Exam        Assessment & Plan:

## 2013-01-27 ENCOUNTER — Encounter: Payer: Self-pay | Admitting: *Deleted

## 2013-03-28 ENCOUNTER — Other Ambulatory Visit: Payer: Self-pay | Admitting: Internal Medicine

## 2013-06-28 ENCOUNTER — Ambulatory Visit (INDEPENDENT_AMBULATORY_CARE_PROVIDER_SITE_OTHER): Payer: BC Managed Care – PPO | Admitting: Internal Medicine

## 2013-06-28 ENCOUNTER — Ambulatory Visit: Payer: BC Managed Care – PPO

## 2013-06-28 VITALS — BP 123/86 | HR 80 | Temp 98.7°F | Resp 16 | Ht 70.0 in | Wt 248.4 lb

## 2013-06-28 DIAGNOSIS — M545 Low back pain, unspecified: Secondary | ICD-10-CM

## 2013-06-28 DIAGNOSIS — S39012A Strain of muscle, fascia and tendon of lower back, initial encounter: Secondary | ICD-10-CM

## 2013-06-28 DIAGNOSIS — S335XXA Sprain of ligaments of lumbar spine, initial encounter: Secondary | ICD-10-CM

## 2013-06-28 MED ORDER — METHOCARBAMOL 750 MG PO TABS: 750.0000 mg | ORAL_TABLET | Freq: Four times a day (QID) | ORAL | Status: DC

## 2013-06-28 MED ORDER — IBUPROFEN 600 MG PO TABS: 600.0000 mg | ORAL_TABLET | Freq: Three times a day (TID) | ORAL | Status: DC | PRN

## 2013-06-28 NOTE — Progress Notes (Signed)
   Subjective:    Patient ID: Tim Dennis, male    DOB: [birth year], 53 y.o.   MRN: [id number]  HPI 53 y/o male to be seen for:  Lower back pain intermittent 4-6 weeks, reoccurred 5-6 days ago. Injured back 60 days ago while exercising at the gym; injury healed. Pt states he began playing golf and believes he may have re-injured his back by doing this.  Pain does not radidate.   Denies numbness or weakness in lower extremeties.  No urinary or bowel incontinence. Denies abdominal pain , no diarrhea.   has take the past few days off work and states this has helped ease the pain.  Review of Systems     Objective:   Physical Exam  Constitutional: He is oriented to person, place, and time. He appears well-developed and well-nourished. No distress.  HENT:  Head: Normocephalic.  Eyes: EOM are normal.  Neck: Normal range of motion.  Pulmonary/Chest: Effort normal.  Musculoskeletal: Normal range of motion. He exhibits tenderness.  Neurological: He is alert and oriented to person, place, and time. He has normal strength and normal reflexes. No sensory deficit. He exhibits normal muscle tone. Coordination normal.  Straight leg normal  Psychiatric: He has a normal mood and affect.      [facility] reading (PRIMARY) by  Dr.Guest normal back xr       Assessment & Plan:  Back exercises taught

## 2013-06-28 NOTE — Patient Instructions (Signed)

## 2013-07-21 ENCOUNTER — Ambulatory Visit (INDEPENDENT_AMBULATORY_CARE_PROVIDER_SITE_OTHER): Payer: BC Managed Care – PPO | Admitting: Family Medicine

## 2013-07-21 VITALS — BP 122/80 | HR 72 | Temp 98.0°F | Resp 16 | Ht 70.0 in | Wt 249.4 lb

## 2013-07-21 DIAGNOSIS — R609 Edema, unspecified: Secondary | ICD-10-CM

## 2013-07-21 DIAGNOSIS — R600 Localized edema: Secondary | ICD-10-CM

## 2013-07-21 DIAGNOSIS — I10 Essential (primary) hypertension: Secondary | ICD-10-CM

## 2013-07-21 DIAGNOSIS — R635 Abnormal weight gain: Secondary | ICD-10-CM

## 2013-07-21 LAB — POCT URINALYSIS DIPSTICK
Bilirubin, UA: NEGATIVE
Glucose, UA: NEGATIVE
KETONES UA: NEGATIVE
Leukocytes, UA: NEGATIVE
Nitrite, UA: NEGATIVE
Protein, UA: NEGATIVE
RBC UA: NEGATIVE
SPEC GRAV UA: 1.015
Urobilinogen, UA: 0.2
pH, UA: 5.5

## 2013-07-21 LAB — BASIC METABOLIC PANEL WITH GFR
BUN: 15 mg/dL (ref 6–23)
CO2: 28 meq/L (ref 19–32)
CREATININE: 1.39 mg/dL — AB (ref 0.50–1.35)
Calcium: 9.5 mg/dL (ref 8.4–10.5)
Chloride: 100 mEq/L (ref 96–112)
GFR, Est African American: 66 mL/min
GFR, Est Non African American: 57 mL/min — ABNORMAL LOW
Glucose, Bld: 85 mg/dL (ref 70–99)
Potassium: 4 mEq/L (ref 3.5–5.3)
Sodium: 138 mEq/L (ref 135–145)

## 2013-07-21 LAB — TSH: TSH: 3.513 u[IU]/mL (ref 0.350–4.500)

## 2013-07-21 NOTE — Patient Instructions (Signed)
You should receive a call or letter about your lab results within the next week to 10 days.   Watch diet and walking for exercise - goal of 150 minutes per week.   If swelling returns - let me know or return to office.   Keep a record of your blood pressures outside of the office and bring them to the next office visit.  Follow up in next 3-6 months.   Return to the clinic or go to the nearest emergency room if any of your symptoms worsen or new symptoms occur.

## 2013-07-21 NOTE — Progress Notes (Signed)
Subjective:    Patient ID: Tim Dennis Lorge, male    DOB: 07/03/1960, 53 y.o.   MRN: [id number]  HPI Tim Dennis Centrella is a [age] y.o. male Prior patient of Dr. Perrin MalteseGuest. Last CPE 01/24/13. Hx of gout and HTN.  Seen few weeks ago with LBP.  Had appt with me to establish care next week, but will be out of town. Back pain improved.   Woke up 3 days ago - both lower legs, calves swollen - mid leg down. Usually takes lisinopril 10mg  qd, then once per week takes lisinopril with HCTZ as BP elevates some on just lisinopril. BP too low in past if takes hctz each day.  No chest pains or dyspnea with LE swelling, and swelling improved after taking lisinopril with HCTZ Monday afternoon. No calf pain. Fasting today. Takes ASA Qd - most of the time. No recent home blood pressures.   Had played golf outside in the heat few days earlier, then ate at [facility] day prior to swelling - may have had more salt than usual.  Fasting today.     Chemistry      Component Value Date/Time   NA 136 01/24/2013 0925   K 4.2 01/24/2013 0925   CL 101 01/24/2013 0925   CO2 26 01/24/2013 0925   BUN 19 01/24/2013 0925   CREATININE 1.31 01/24/2013 0925      Component Value Date/Time   CALCIUM 9.5 01/24/2013 0925   ALKPHOS 78 01/24/2013 0925   AST 32 01/24/2013 0925   ALT 32 01/24/2013 0925   BILITOT 0.4 01/24/2013 0925       Patient Active Problem List   Diagnosis Date Noted  . HTN (hypertension) 06/02/2011  . Gout 06/02/2011   Past Medical History  Diagnosis Date  . Hypertension   . Gout    Past Surgical History  Procedure Laterality Date  . Achilies repair     No Known Allergies Prior to Admission medications   Medication Sig Start Date End Date Taking? Authorizing Provider  allopurinol (ZYLOPRIM) 100 MG tablet TAKE 1 TABLET EVERY DAY FOR GOUT 12/25/12  Yes Morrell RiddleSarah L [name], PA-C  aspirin 81 MG tablet Take 81 mg by mouth daily.   Yes Historical Provider, MD  ibuprofen (ADVIL,MOTRIN) 600 MG tablet  Take 1 tablet (600 mg total) by mouth every 8 (eight) hours as needed. 06/28/13  Yes Jonita Albeehris W Guest, MD  lisinopril (PRINIVIL,ZESTRIL) 10 MG tablet TAKE 1 TABLET (10 MG TOTAL) BY MOUTH DAILY. 12/25/12  Yes Morrell RiddleSarah L [name], PA-C  lisinopril-hydrochlorothiazide (PRINZIDE,ZESTORETIC) 10-12.5 MG per tablet TAKE 1 TABLET EVERY DAY 04/27/12  Yes Heather M Marte, PA-C  methocarbamol (ROBAXIN-750) 750 MG tablet Take 1 tablet (750 mg total) by mouth 4 (four) times daily. 06/28/13  Yes Jonita Albeehris W Guest, MD   History   Social History  . Marital Status: Married    Spouse Name: N/A    Number of Children: N/A  . Years of Education: N/A   Occupational History  . Not on file.   Social History Main Topics  . Smoking status: Former Smoker    Quit date: 10/08/1976  . Smokeless tobacco: Not on file  . Alcohol Use: Not on file  . Drug Use: Not on file  . Sexual Activity: Not on file   Other Topics Concern  . Not on file   Social History Narrative  . No narrative on file       Review of Systems  Constitutional: Positive for unexpected weight  change (12 pounds gain with less exercise and eating habits past year. ). Negative for fatigue.  Eyes: Negative for visual disturbance.  Respiratory: Negative for cough, chest tightness and shortness of breath.   Cardiovascular: Positive for leg swelling. Negative for chest pain and palpitations.  Gastrointestinal: Negative for abdominal pain and blood in stool.  Neurological: Negative for dizziness, light-headedness and headaches.       Objective:   Physical Exam  Vitals reviewed. Constitutional: He is oriented to person, place, and time. He appears well-developed and well-nourished.  HENT:  Head: Normocephalic and atraumatic.  Eyes: EOM are normal. Pupils are equal, round, and reactive to light.  Neck: No JVD present. Carotid bruit is not present.  Cardiovascular: Normal rate, regular rhythm and normal heart sounds.   No murmur heard. Pulmonary/Chest:  Effort normal and breath sounds normal. He has no rales.  Abdominal: Soft. There is no tenderness.  Musculoskeletal: He exhibits no edema (trace pedal edema at sock line, non pitting. ).  Neurological: He is alert and oriented to person, place, and time.  Skin: Skin is warm and dry.  Psychiatric: He has a normal mood and affect. His behavior is normal.    Filed Vitals:   07/21/13 0846  BP: 122/80  Pulse: 72  Temp: 98 F (36.7 C)  TempSrc: Oral  Resp: 16  Height: 5\' 10"  (1.778 m)  Weight: 249 lb 6.4 oz (113.127 kg)  SpO2: 99%   Results for orders placed in visit on 07/21/13  POCT URINALYSIS DIPSTICK      Result Value Ref Range   Color, UA YELLOW     Clarity, UA CLEAR     Glucose, UA NEG     Bilirubin, UA NEG     Ketones, UA NEG     Spec Grav, UA 1.015     Blood, UA NEG     pH, UA 5.5     Protein, UA NEG     Urobilinogen, UA 0.2     Nitrite, UA NEG]     Leukocytes, UA Negative           Assessment & Plan:  Tim Dennis Celestine is a 53 y.o. male Essential hypertension - Plan: TSH, BASIC METABOLIC PANEL WITH GFR, POCT urinalysis dipstick  - stable. Ok to continue current regimen, including once weekly hctz, but check outside BP's and recheck in next 3-6 months.   Bilateral leg edema - Plan: TSH, BASIC METABOLIC PANEL WITH GFR, POCT urinalysis dipstick  - now improved.  Suspect combo of heat, salt intake.  Some weight gain with diet and lack of exercise.   - maintain hydration, check BMP, TSH. rtc precautions.   Weight gain - Plan: TSH  - diet, exercise discussed. Labs pending.   No orders of the defined types were placed in this encounter.   Patient Instructions  You should receive a call or letter about your lab results within the next week to 10 days.   Watch diet and walking for exercise - goal of 150 minutes per week.   If swelling returns - let me know or return to office.   Keep a record of your blood pressures outside of the office and bring them to the  next office visit.  Follow up in next 3-6 months.   Return to the clinic or go to the nearest emergency room if any of your symptoms worsen or new symptoms occur.

## 2013-07-25 ENCOUNTER — Ambulatory Visit: Payer: BC Managed Care – PPO | Admitting: Family Medicine

## 2013-08-29 ENCOUNTER — Other Ambulatory Visit: Payer: Self-pay | Admitting: Physician Assistant

## 2013-08-29 NOTE — Telephone Encounter (Signed)
Changed sig to reflect 07/21/13 OV instr's.

## 2013-12-19 ENCOUNTER — Other Ambulatory Visit: Payer: Self-pay | Admitting: Physician Assistant

## 2014-01-06 ENCOUNTER — Ambulatory Visit (INDEPENDENT_AMBULATORY_CARE_PROVIDER_SITE_OTHER): Payer: BC Managed Care – PPO | Admitting: Internal Medicine

## 2014-01-06 VITALS — BP 138/80 | HR 95 | Temp 98.3°F | Resp 18 | Ht 70.0 in | Wt 256.8 lb

## 2014-01-06 DIAGNOSIS — R05 Cough: Secondary | ICD-10-CM

## 2014-01-06 DIAGNOSIS — R059 Cough, unspecified: Secondary | ICD-10-CM

## 2014-01-06 MED ORDER — HYDROCODONE-HOMATROPINE 5-1.5 MG/5ML PO SYRP: 5.0000 mL | ORAL_SOLUTION | Freq: Four times a day (QID) | ORAL | Status: AC | PRN

## 2014-01-06 MED ORDER — AZITHROMYCIN 250 MG PO TABS: ORAL_TABLET | ORAL | Status: AC

## 2014-01-06 NOTE — Progress Notes (Signed)
Subjective:  This chart was scribed for Tim Siaobert Vontrell Pullman, MD by Jarvis Morganaylor Ferguson, Medical Scribe. This patient was seen in Room 1 and the patient's care was started at 3:39 PM.   Patient ID: Tim Dennis, male    DOB: 05/11/1960, 53 y.o.   MRN: [id number]  Chief Complaint  Patient presents with  . Cough    x5 days; clear sputum  . Chest Pain    due to the cough  . Sore Throat    HPI HPI Comments: Tim Dennis is a 53 y.o. male who presents to the [facility] complaining of constant, moderate, sore throat for 5 days. He is having associated cough productive of clear and green sputum and congestion. He states that the cough is keeping him up at night. He states he is having some chest tightness due to the cough. He has not taken anything for the symptoms. Pt denies any diaphoresis upon waking. Pt has had some sick contacts through his co-workers. He denies any fever or shortness of breath. No h/o asthma or respiratory issues.  Son [name] played soccer gysa  Patient Active Problem List   Diagnosis Date Noted  . HTN (hypertension) 06/02/2011  . Gout 06/02/2011   Past Medical History  Diagnosis Date  . Hypertension   . Gout    Past Surgical History  Procedure Laterality Date  . Achilies repair     No Known Allergies Prior to Admission medications   Medication Sig Start Date End Date Taking? Authorizing Provider  allopurinol (ZYLOPRIM) 100 MG tablet Take 1 tablet (100 mg total) by mouth daily. PATIENT NEEDS OFFICE VISIT FOR ADDITIONAL REFILLS 12/19/13  Yes Shade FloodJeffrey R Greene, MD  aspirin 81 MG tablet Take 81 mg by mouth daily.   Yes Historical Provider, MD  lisinopril (PRINIVIL,ZESTRIL) 10 MG tablet TAKE 1 TABLET (10 MG TOTAL) BY MOUTH DAILY. 12/25/12  Yes Morrell RiddleSarah L [name], PA-C  lisinopril-hydrochlorothiazide (PRINZIDE,ZESTORETIC) 10-12.5 MG per tablet Take 1 tablet by mouth once a week. 08/29/13  Yes Shade FloodJeffrey R Greene, MD   History   Social History  .  Marital Status: Married    Spouse Name: N/A    Number of Children: N/A  . Years of Education: N/A   Occupational History  . Not on file.   Social History Main Topics  . Smoking status: Former Smoker    Quit date: 10/08/1976  . Smokeless tobacco: Not on file  . Alcohol Use: Not on file  . Drug Use: Not on file  . Sexual Activity: Not on file   Other Topics Concern  . Not on file   Social History Narrative  Works at [employer]   Review of Systems  All other systems reviewed and are negative.      Objective:   Physical Exam  Constitutional: He is oriented to person, place, and time. He appears well-developed and well-nourished. No distress.  HENT:  Head: Normocephalic and atraumatic.  Right Ear: External ear normal.  Left Ear: External ear normal.  Nose: Nose normal.  Mouth/Throat: Oropharynx is clear and moist.  Eyes: Conjunctivae and EOM are normal. Pupils are equal, round, and reactive to light.  Neck: Neck supple.  Cardiovascular: Normal rate, regular rhythm and normal heart sounds.   Pulmonary/Chest: Effort normal. No respiratory distress. He has rhonchi (at right base).  Musculoskeletal: Normal range of motion.  Neurological: He is alert and oriented to person, place, and time.  Skin: Skin is warm and dry.  Psychiatric: He has  a normal mood and affect. His behavior is normal.  Nursing note and vitals reviewed.  Filed Vitals:   01/06/14 1510  BP: 138/80  Pulse: 95  Temp: 98.3 F (36.8 C)  Resp: 18       Assessment & Plan:  Acute viral bronchitis vs mycoplasma Meds ordered this encounter  Medications  . azithromycin (ZITHROMAX) 250 MG tablet    Sig: As packaged    Dispense:  6 tablet    Refill:  0  . HYDROcodone-homatropine (HYCODAN) 5-1.5 MG/5ML syrup    Sig: Take 5 mLs by mouth every 6 (six) hours as needed.    Dispense:  120 mL    Refill:  0    I personally performed the services described in this documentation, which was scribed in my presence. The  recorded information has been reviewed and is accurate. I have completed the patient encounter in its entirety as documented by the scribe, with editing by me where necessary. Sarabi Sockwell P. Merla Richesoolittle, M.D.

## 2014-01-15 ENCOUNTER — Other Ambulatory Visit: Payer: Self-pay | Admitting: Family Medicine

## 2014-02-09 ENCOUNTER — Other Ambulatory Visit: Payer: Self-pay | Admitting: Family Medicine

## 2014-02-22 ENCOUNTER — Other Ambulatory Visit: Payer: Self-pay | Admitting: Physician Assistant

## 2014-02-24 ENCOUNTER — Other Ambulatory Visit: Payer: Self-pay | Admitting: Family Medicine

## 2014-03-24 ENCOUNTER — Other Ambulatory Visit: Payer: Self-pay | Admitting: Physician Assistant

## 2014-04-23 ENCOUNTER — Other Ambulatory Visit: Payer: Self-pay | Admitting: Family Medicine

## 2014-04-27 ENCOUNTER — Other Ambulatory Visit: Payer: Self-pay | Admitting: Family Medicine

## 2014-05-11 ENCOUNTER — Telehealth: Payer: Self-pay | Admitting: Family Medicine

## 2014-05-11 NOTE — Telephone Encounter (Signed)
Called to reschedule appt with Dr Neva SeatGreene patient [facility] that [facility] had already gotten a CPE with them and medicine refills he states that it was always so hard to get into see Dr Neva SeatGreene

## 2014-05-15 ENCOUNTER — Encounter: Payer: BC Managed Care – PPO | Admitting: Family Medicine

## 2014-07-31 IMAGING — CR DG SHOULDER 2+V*R*
2 series · 2 of 2 positions shown · non-contrast
Comparison: None.

CLINICAL DATA: Shoulder pain for 8 days.

RIGHT SHOULDER - 2+ VIEW

[ap ext rot]
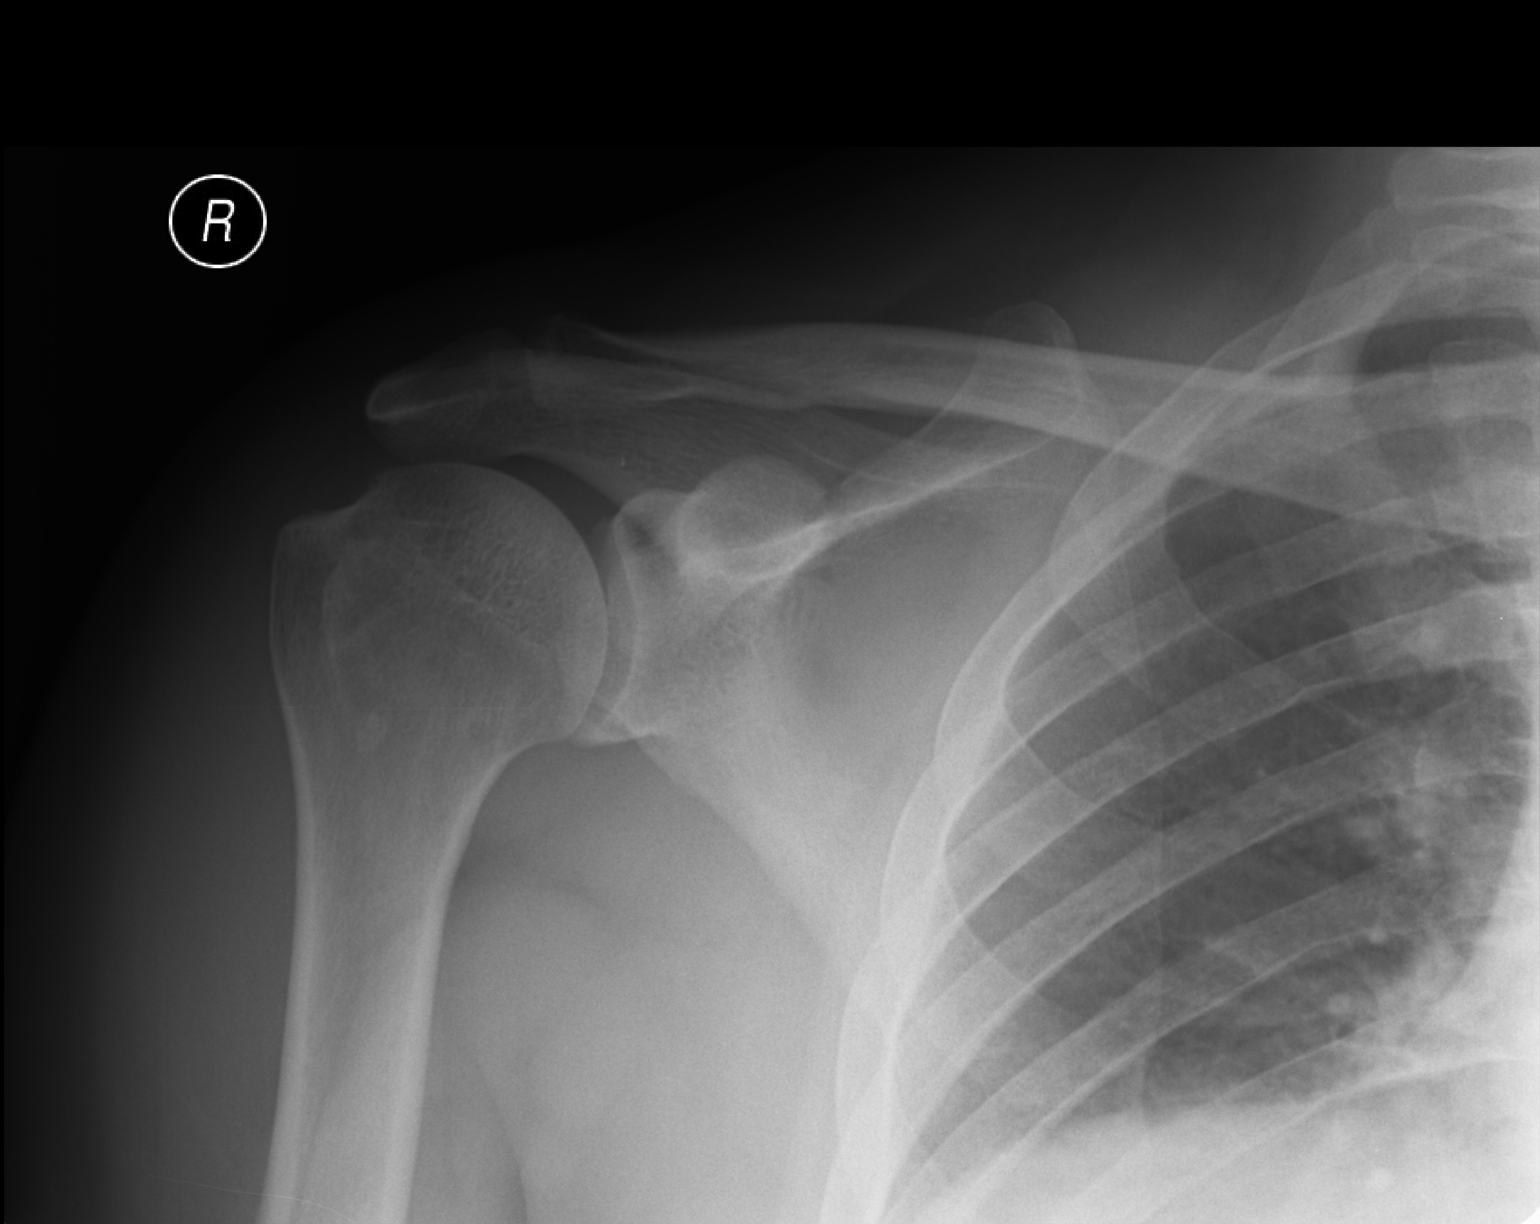

[ap int rot]
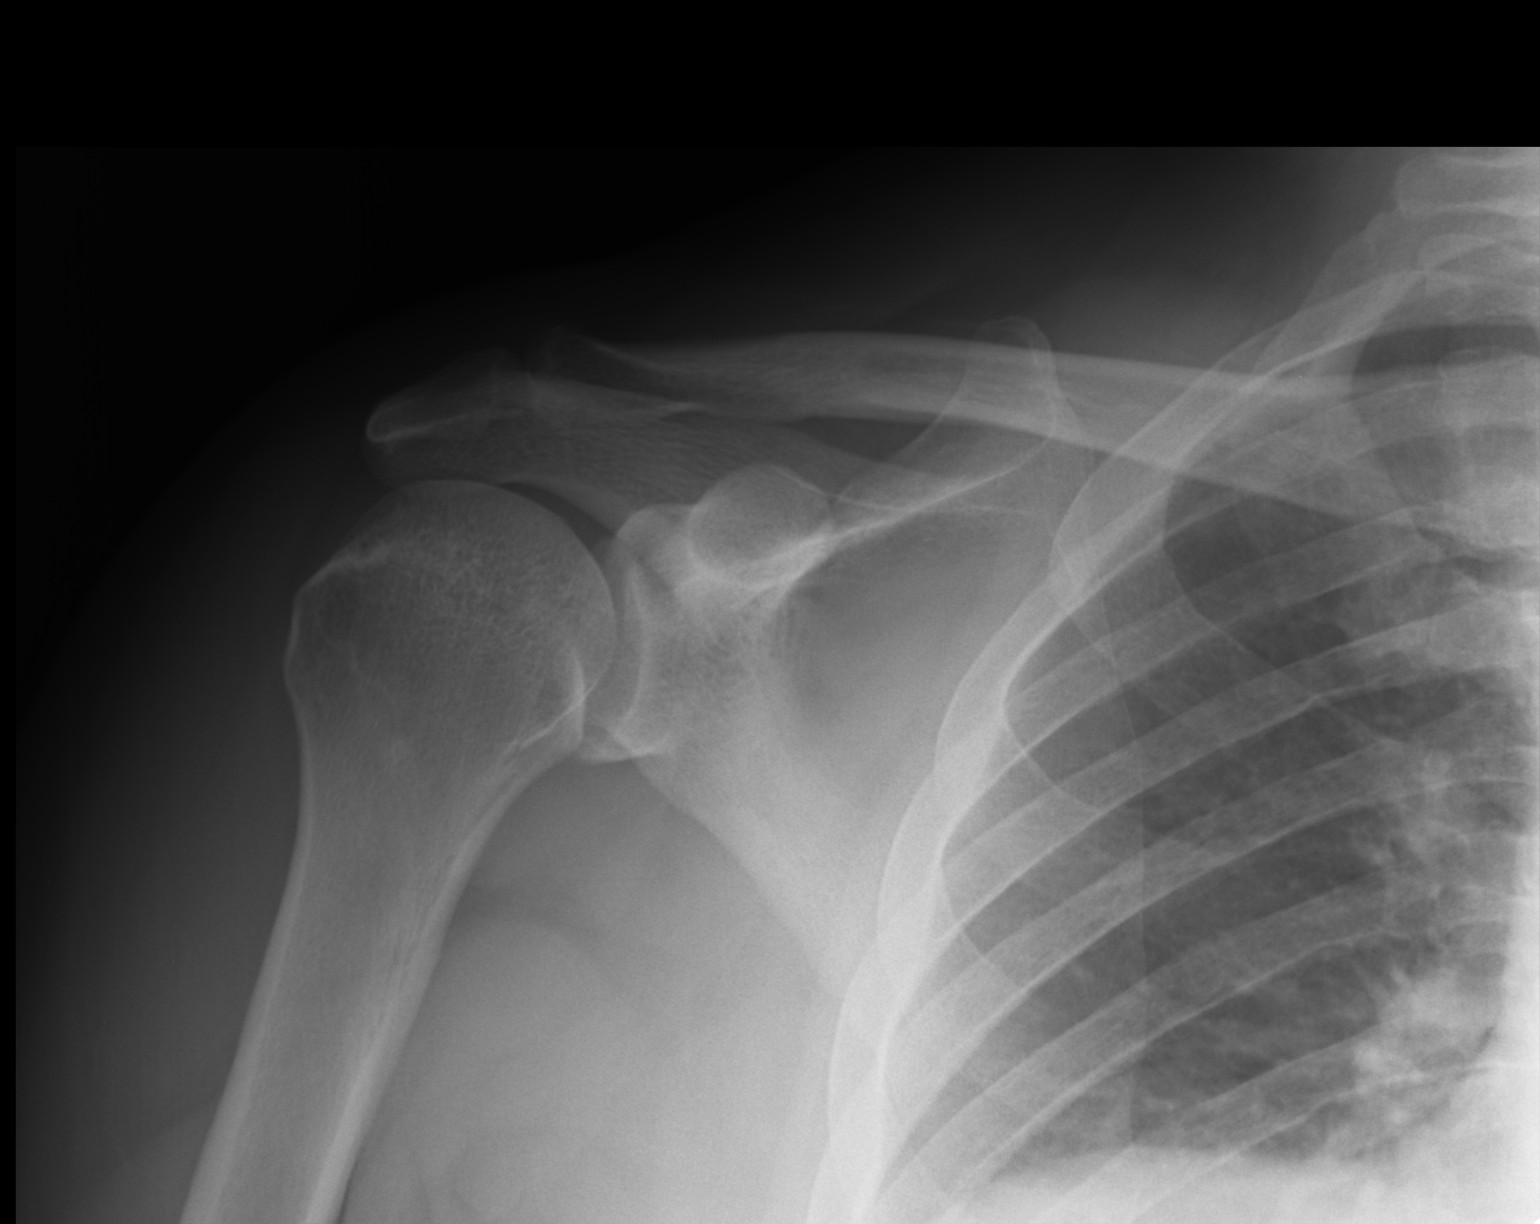

[2 of 2 positions shown; findings below may reference images not displayed]

FINDINGS: No acute bony or joint abnormality is identified.  No
notable degenerative change.  Imaged right lung and ribs are
unremarkable.
IMPRESSION: Negative exam.

## 2019-03-31 ENCOUNTER — Ambulatory Visit: Payer: Self-pay | Attending: Family

## 2019-03-31 DIAGNOSIS — Z23 Encounter for immunization: Secondary | ICD-10-CM | POA: Insufficient documentation

## 2019-03-31 NOTE — Progress Notes (Signed)
   Covid-19 Vaccination Clinic  Name:  Tim Dennis    MRN: [id number] DOB: Feb 13, 1960  03/31/2019  Tim Dennis was observed post Covid-19 immunization for 15 minutes without incident. He was provided with Vaccine Information Sheet and instruction to access the V-Safe system.   Tim Dennis was instructed to call 911 with any severe reactions post vaccine: [name] Difficulty breathing  . Swelling of face and throat  . A fast heartbeat  . A bad rash all over body  . Dizziness and weakness   Immunizations Administered    Name Date Dose VIS Date Route   Moderna COVID-19 Vaccine 03/31/2019  3:52 PM 0.5 mL 12/28/2018 Intramuscular   Manufacturer: Moderna   Lot: [id number]   NDC: 48546-270-35

## 2019-05-03 ENCOUNTER — Ambulatory Visit: Payer: Self-pay | Attending: Family

## 2019-05-03 DIAGNOSIS — Z23 Encounter for immunization: Secondary | ICD-10-CM

## 2019-05-03 NOTE — Progress Notes (Signed)
   Covid-19 Vaccination Clinic  Name:  Tim Dennis    MRN: [id number] DOB: 11-08-1960  05/03/2019  [name] was observed post Covid-19 immunization for 15 minutes without incident. He was provided with Vaccine Information Sheet and instruction to access the V-Safe system.   [name] was instructed to call 911 with any severe reactions post vaccine: [name] Difficulty breathing  . Swelling of face and throat  . A fast heartbeat  . A bad rash all over body  . Dizziness and weakness   Immunizations Administered    Name Date Dose VIS Date Route   Moderna COVID-19 Vaccine 05/03/2019  4:06 PM 0.5 mL 12/28/2018 Intramuscular   Manufacturer: Moderna   Lot: [id number]   NDC: 64332-951-88

## 2020-01-11 ENCOUNTER — Ambulatory Visit: Payer: Self-pay | Attending: Family

## 2020-01-11 DIAGNOSIS — Z23 Encounter for immunization: Secondary | ICD-10-CM

## 2020-05-10 NOTE — Progress Notes (Signed)
   Covid-19 Vaccination Clinic  Name:  Tim Dennis    MRN: [id number] DOB: Apr 12, 1960  05/10/2020  [name] was observed post Covid-19 immunization for 15 minutes without incident. He was provided with Vaccine Information Sheet and instruction to access the V-Safe system.   [name] was instructed to call 911 with any severe reactions post vaccine: [name] Difficulty breathing  . Swelling of face and throat  . A fast heartbeat  . A bad rash all over body  . Dizziness and weakness   Immunizations Administered    Name Date Dose VIS Date Route   Moderna Covid-19 Booster Vaccine 01/11/2020 10:00 AM 0.25 mL 11/16/2019 Intramuscular   Manufacturer: Moderna   Lot: [id number]   NDC: 27062-376-28

## 2020-06-13 ENCOUNTER — Ambulatory Visit (INDEPENDENT_AMBULATORY_CARE_PROVIDER_SITE_OTHER): Payer: BC Managed Care – PPO | Admitting: Otolaryngology

## 2020-06-13 ENCOUNTER — Other Ambulatory Visit: Payer: Self-pay

## 2020-06-13 VITALS — Temp 97.3°F

## 2020-06-13 DIAGNOSIS — R040 Epistaxis: Secondary | ICD-10-CM

## 2020-06-13 NOTE — Progress Notes (Signed)
HPI: Tim Dennis is a 60 y.o. male who presents is referred by Dr. Donette Larry for evaluation of recurrent right-sided epistaxis.  Several months ago he was having recurrent right-sided nosebleeds.  He has been using saline rinses and the bleeding has seemed to stop as he has not had a nosebleed in the past month.  He is having no difficulty breathing through his nose.[name]  Past Medical History:  Diagnosis Date  . Gout   . Hypertension    Past Surgical History:  Procedure Laterality Date  . achilies repair     Social History   Socioeconomic History  . Marital status: Married    Spouse name: Not on file  . Number of children: Not on file  . Years of education: Not on file  . Highest education level: Not on file  Occupational History  . Not on file  Tobacco Use  . Smoking status: Former Smoker    Quit date: 10/08/1976    Years since quitting: 43.7  . Smokeless tobacco: Not on file  Substance and Sexual Activity  . Alcohol use: Not on file  . Drug use: Not on file  . Sexual activity: Not on file  Other Topics Concern  . Not on file  Social History Narrative  . Not on file   Social Determinants of Health   Financial Resource Strain: Not on file  Food Insecurity: Not on file  Transportation Needs: Not on file  Physical Activity: Not on file  Stress: Not on file  Social Connections: Not on file   Family History  Problem Relation Age of Onset  . Hypertension Mother   . Kidney disease Mother   . Hypertension Maternal Grandmother   . Hypertension Maternal Grandfather    Allergies  Allergen Reactions  . Tramadol    Prior to Admission medications   Medication Sig Start Date End Date Taking? Authorizing Provider  allopurinol (ZYLOPRIM) 100 MG tablet TAKE 1 TABLET (100 MG TOTAL) BY MOUTH DAILY. PATIENT NEEDS OFFICE VISIT FOR ADDITIONAL REFILLS 04/25/14   [name] D, PA  aspirin 81 MG tablet Take 81 mg by mouth daily.    [provider]  azithromycin  (ZITHROMAX) 250 MG tablet As packaged 01/06/14   [name], MD  HYDROcodone-homatropine [facility]) 5-1.5 MG/5ML syrup Take 5 mLs by mouth every 6 (six) hours as needed. 01/06/14   [name], MD  lisinopril (PRINIVIL,ZESTRIL) 10 MG tablet TAKE 1 TABLET (10 MG TOTAL) BY MOUTH DAILY. 12/25/12   [name], [name], PA-C  lisinopril-hydrochlorothiazide (PRINZIDE,ZESTORETIC) 10-12.5 MG per tablet TAKE 1 TABLET BY MOUTH ONCE A WEEK. 02/09/14   [name], MD     Positive ROS: Otherwise negative  All other systems have been reviewed and were otherwise negative with the exception of those mentioned in the HPI and as above.  Physical Exam: Constitutional: Alert, well-appearing, no acute distress Ears: External ears without lesions or tenderness. Ear canals are clear bilaterally with intact, clear TMs.  Nasal: External nose without lesions. Septum with minimal deformity.  He has some crusting along the anterior inferior septum on both sides of the right side a bit worse than the left.  He has several prominent vessels in Kiesselbach's plexus but nothing having a tendency to bleed in the office today.  Furthermore posteriorly and superiorly the nasal passages are clear.  Both middle meatus regions are clear. Oral: Lips and gums without lesions. Tongue and palate mucosa without lesions. Posterior oropharynx clear. Neck: No palpable adenopathy  or masses Respiratory: Breathing comfortably  Skin: No facial/neck lesions or rash noted.  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  No recent bleeding in the past month.  Plan: Reviewed with him that the bleeding site is most likely related to vessels along the septum anteriorly on the right side.  Recommended continue use of the saline irrigation and could also use nasal gel to help keep the mucosa moist. If he has any persistent bleeding or frequent  recurrent bleeding he will follow-up for cauterization if needed in the future.   Narda Bonds, MD   CC:

## 2020-12-06 ENCOUNTER — Ambulatory Visit: Payer: BC Managed Care – PPO | Attending: Family

## 2020-12-06 DIAGNOSIS — Z23 Encounter for immunization: Secondary | ICD-10-CM

## 2020-12-06 NOTE — Progress Notes (Signed)
   Covid-19 Vaccination Clinic  Name:  Tim Dennis    MRN: [id number] DOB: [birth year]  12/06/2020  [name] was observed post Covid-19 immunization for 15 minutes without incident. He was provided with Vaccine Information Sheet and instruction to access the V-Safe system.   [name] was instructed to call 911 with any severe reactions post vaccine: Difficulty breathing  Swelling of face and throat  A fast heartbeat  A bad rash all over body  Dizziness and weakness   Immunizations Administered     Name Date Dose VIS Date Route   Moderna Covid-19 vaccine Bivalent Booster 12/06/2020  5:20 PM 0.5 mL 09/08/2020 Intramuscular   Manufacturer: Moderna   Lot: [id number]   NDC: 11657-903-83
